# Patient Record
Sex: Female | Born: 1990 | Race: Black or African American | Hispanic: No | Marital: Single | Smoking: Never smoker
Health system: Southern US, Community
[De-identification: ages and names within clinical notes are randomized; demographics above are authoritative.]

## PROBLEM LIST (undated history)

## (undated) DIAGNOSIS — M419 Scoliosis, unspecified: Secondary | ICD-10-CM

## (undated) HISTORY — PX: WISDOM TOOTH EXTRACTION: SHX21

---

## 2015-09-12 ENCOUNTER — Emergency Department (HOSPITAL_COMMUNITY): Payer: 59

## 2015-09-12 ENCOUNTER — Encounter (HOSPITAL_COMMUNITY): Payer: Self-pay | Admitting: Emergency Medicine

## 2015-09-12 ENCOUNTER — Emergency Department (HOSPITAL_COMMUNITY)
Admission: EM | Admit: 2015-09-12 | Discharge: 2015-09-12 | Disposition: A | Discharge status: Home / Self Care | Payer: 59 | Attending: Emergency Medicine | Admitting: Emergency Medicine

## 2015-09-12 DIAGNOSIS — Y9289 Other specified places as the place of occurrence of the external cause: Secondary | ICD-10-CM | POA: Diagnosis not present

## 2015-09-12 DIAGNOSIS — R200 Anesthesia of skin: Secondary | ICD-10-CM | POA: Diagnosis not present

## 2015-09-12 DIAGNOSIS — W07XXXA Fall from chair, initial encounter: Secondary | ICD-10-CM | POA: Diagnosis not present

## 2015-09-12 DIAGNOSIS — S0990XA Unspecified injury of head, initial encounter: Secondary | ICD-10-CM | POA: Insufficient documentation

## 2015-09-12 DIAGNOSIS — M419 Scoliosis, unspecified: Secondary | ICD-10-CM | POA: Diagnosis not present

## 2015-09-12 DIAGNOSIS — M545 Low back pain: Secondary | ICD-10-CM

## 2015-09-12 DIAGNOSIS — S199XXA Unspecified injury of neck, initial encounter: Secondary | ICD-10-CM | POA: Insufficient documentation

## 2015-09-12 DIAGNOSIS — S3992XA Unspecified injury of lower back, initial encounter: Secondary | ICD-10-CM | POA: Diagnosis not present

## 2015-09-12 DIAGNOSIS — Y9389 Activity, other specified: Secondary | ICD-10-CM | POA: Insufficient documentation

## 2015-09-12 DIAGNOSIS — W19XXXA Unspecified fall, initial encounter: Secondary | ICD-10-CM

## 2015-09-12 DIAGNOSIS — Y998 Other external cause status: Secondary | ICD-10-CM | POA: Insufficient documentation

## 2015-09-12 HISTORY — DX: Scoliosis, unspecified: M41.9

## 2015-09-12 MED ORDER — METHOCARBAMOL 500 MG PO TABS: 500.0000 mg | ORAL_TABLET | Freq: Two times a day (BID) | ORAL | Status: AC

## 2015-09-12 MED ORDER — IBUPROFEN 600 MG PO TABS: 600.0000 mg | ORAL_TABLET | Freq: Four times a day (QID) | ORAL | Status: AC | PRN

## 2015-09-12 NOTE — ED Notes (Signed)
Patient able to dress and ambulate independently 

## 2015-09-12 NOTE — ED Provider Notes (Signed)
CSN: [id number]     Arrival date & time 09/12/15  1635 History  By signing my name below, I, Octavia Heirrianna Nassar, attest that this documentation has been prepared under the direction and in the presence of Elson AreasLeslie K Ameir Faria, PA-C. Electronically Signed: Octavia HeirArianna Nassar, ED Scribe. 09/12/2015. 6:37 PM.    Chief Complaint  Patient presents with  . Fall      The history is provided by the patient. No language interpreter was used.   HPI Comments: Brandy Hutchinson is a 25 y.o. female who presents to the Emergency Department complaining of a sudden onset, gradual worsening, moderate, fall onset 3 days ago. She expresses head pain, neck pain, and all over back pain. She says that her hips hurt as well and she has been having tingling in her fingers. Pt states she tripped over some chairs and fell backwards, landing on her back. Pt has been taking ibuprofen, icy hot and applying ice to alleviate her pain with no relief. Denies loss of consciousness and abdominal pain.   Past Medical History  Diagnosis Date  . Scoliosis    Past Surgical History  Procedure Laterality Date  . Wisdom tooth extraction     History reviewed. No pertinent family history. Social History  Substance Use Topics  . Smoking status: Never Smoker   . Smokeless tobacco: None  . Alcohol Use: Yes     Comment: occ   OB History    Gravida Para Term Preterm AB TAB SAB Ectopic Multiple Living   0 0 0 0 0 0 0 0 0 0      Review of Systems  Gastrointestinal: Negative for abdominal pain.  Musculoskeletal: Positive for back pain and neck pain.  Neurological: Positive for numbness and headaches.  All other systems reviewed and are negative.     Allergies  Review of patient's allergies indicates no known allergies.  Home Medications   Prior to Admission medications   Not on File   Triage vitals: BP 124/76 mmHg  Pulse 75  Temp(Src) 98.7 F (37.1 C) (Oral)  Resp 20  Ht 5' (1.524 m)  Wt 162 lb (73.483 kg)  BMI 31.64 kg/m2  SpO2  98%  LMP 08/08/2015 Physical Exam  Constitutional: She is oriented to person, place, and time. She appears well-developed and well-nourished.  HENT:  Head: Normocephalic.  Eyes: EOM are normal.  Neck: Normal range of motion.  Pulmonary/Chest: Effort normal.  Abdominal: She exhibits no distension.  Musculoskeletal: Normal range of motion.  Tender in full thoracic spine  Neurological: She is alert and oriented to person, place, and time.  Psychiatric: She has a normal mood and affect.  Nursing note and vitals reviewed.   ED Course  Procedures  DIAGNOSTIC STUDIES: Oxygen Saturation is 98% on RA, normal by my interpretation.  COORDINATION OF CARE:  6:36 PM Will order DG cervical spine and DG thoracic spine.  Discussed treatment plan with pt at bedside and pt agreed to plan.  Labs Review Labs Reviewed - No data to display  Imaging Review Dg Cervical Spine Complete  09/12/2015  CLINICAL DATA:  Pain following fall EXAM: CERVICAL SPINE - COMPLETE 4+ VIEW COMPARISON:  None. FINDINGS: Frontal, lateral, open-mouth odontoid, and bilateral oblique views were obtained. There is no fracture or spondylolisthesis. Prevertebral soft tissues and predental space regions are normal. The disc spaces appear normal. There is no appreciable facet arthropathy on the oblique views. There is reversal of lordotic curvature. IMPRESSION: No fracture or spondylolisthesis. No appreciable arthropathy. Reversal of  lordotic curvature likely indicates muscle spasm. If there is any concern clinically for ligamentous injury, lateral flexion-extension views could be helpful to further assess. Electronically Signed   By: Bretta Bang III M.D.   On: 09/12/2015 19:40   Dg Thoracic Spine 2 View  09/12/2015  CLINICAL DATA:  Pain following fall EXAM: THORACIC SPINE 3 VIEWS COMPARISON:  None. FINDINGS: Frontal, lateral, and swimmer's views were obtained. There is thoracolumbar dextroscoliosis. There is no fracture or  spondylolisthesis. The disc spaces appear normal. No erosive change or paraspinous lesion. IMPRESSION: Thoracolumbar dextroscoliosis. No fracture or spondylolisthesis. No appreciable arthropathy. Electronically Signed   By: Bretta Bang III M.D.   On: 09/12/2015 19:41   I have personally reviewed and evaluated these images and lab results as part of my medical decision-making.   EKG Interpretation None      MDM xrays no acute abnormality.  Rx for ibuprofen and Robaxin.   Pt advised to follow up with her MD for recheck if symptoms persist.    Final diagnoses:  Fall, initial encounter  Low back pain, unspecified back pain laterality, with sciatica presence unspecified   Robaxin  Ibuprofen An After Visit Summary was printed and given to the patient.    Elson Areas, PA-C 09/12/15 2018  Lonia Skinner Camargo, PA-C 09/12/15 2018  Lyndal Pulley, MD 09/13/15 0200

## 2015-09-12 NOTE — ED Notes (Signed)
Pt states she tripped over a chair and fell hitting the back of her head. Pt c/o pain from her head to her knees.

## 2015-09-12 NOTE — Discharge Instructions (Signed)
Back Pain, Adult °Back pain is very common in adults. The cause of back pain is rarely dangerous and the pain often gets better over time. The cause of your back pain may not be known. Some common causes of back pain include: °· Strain of the muscles or ligaments supporting the spine. °· Wear and tear (degeneration) of the spinal disks. °· Arthritis. °· Direct injury to the back. °For many people, back pain may return. Since back pain is rarely dangerous, most people can learn to manage this condition on their own. °HOME CARE INSTRUCTIONS °Watch your back pain for any changes. The following actions may help to lessen any discomfort you are feeling: °· Remain active. It is stressful on your back to sit or stand in one place for long periods of time. Do not sit, drive, or stand in one place for more than 30 minutes at a time. Take short walks on even surfaces as soon as you are able. Try to increase the length of time you walk each day. °· Exercise regularly as directed by your health care provider. Exercise helps your back heal faster. It also helps avoid future injury by keeping your muscles strong and flexible. °· Do not stay in bed. Resting more than 1-2 days can delay your recovery. °· Pay attention to your body when you bend and lift. The most comfortable positions are those that put less stress on your recovering back. Always use proper lifting techniques, including: °· Bending your knees. °· Keeping the load close to your body. °· Avoiding twisting. °· Find a comfortable position to sleep. Use a firm mattress and lie on your side with your knees slightly bent. If you lie on your back, put a pillow under your knees. °· Avoid feeling anxious or stressed. Stress increases muscle tension and can worsen back pain. It is important to recognize when you are anxious or stressed and learn ways to manage it, such as with exercise. °· Take medicines only as directed by your health care provider. Over-the-counter  medicines to reduce pain and inflammation are often the most helpful. Your health care provider may prescribe muscle relaxant drugs. These medicines help dull your pain so you can more quickly return to your normal activities and healthy exercise. °· Apply ice to the injured area: °· Put ice in a plastic bag. °· Place a towel between your skin and the bag. °· Leave the ice on for 20 minutes, 2-3 times a day for the first 2-3 days. After that, ice and heat may be alternated to reduce pain and spasms. °· Maintain a healthy weight. Excess weight puts extra stress on your back and makes it difficult to maintain good posture. °SEEK MEDICAL CARE IF: °· You have pain that is not relieved with rest or medicine. °· You have increasing pain going down into the legs or buttocks. °· You have pain that does not improve in one week. °· You have night pain. °· You lose weight. °· You have a fever or chills. °SEEK IMMEDIATE MEDICAL CARE IF:  °· You develop new bowel or bladder control problems. °· You have unusual weakness or numbness in your arms or legs. °· You develop nausea or vomiting. °· You develop abdominal pain. °· You feel faint. °  °This information is not intended to replace advice given to you by your health care provider. Make sure you discuss any questions you have with your health care provider. °  °Document Released: 05/21/2005 Document Revised: 06/11/2014 Document Reviewed: 09/22/2013 °Elsevier Interactive Patient Education ©2016 Elsevier   Inc[employer] Fall Prevention in the Home  Falls can cause injuries and can affect people from all age groups. There are many simple things that you can do to make your home safe and to help prevent falls. WHAT CAN I DO ON THE OUTSIDE OF MY HOME?  Regularly repair the edges of walkways and driveways and fix any cracks.  Remove high doorway thresholds.  Trim any shrubbery on the main path into your home.  Use bright outdoor lighting.  Clear walkways of debris and clutter,  including tools and rocks.  Regularly check that handrails are securely fastened and in good repair. Both sides of any steps should have handrails.  Install guardrails along the edges of any raised decks or porches.  Have leaves, snow, and ice cleared regularly.  Use sand or salt on walkways during winter months.  In the garage, clean up any spills right away, including grease or oil spills. WHAT CAN I DO IN THE BATHROOM?  Use night lights.  Install grab bars by the toilet and in the tub and shower. Do not use towel bars as grab bars.  Use non-skid mats or decals on the floor of the tub or shower.  If you need to sit down while you are in the shower, use a plastic, non-slip stool.Marland Kitchen  Keep the floor dry. Immediately clean up any water that spills on the floor.  Remove soap buildup in the tub or shower on a regular basis.  Attach bath mats securely with double-sided non-slip rug tape.  Remove throw rugs and other tripping hazards from the floor. WHAT CAN I DO IN THE BEDROOM?  Use night lights.  Make sure that a bedside light is easy to reach.  Do not use oversized bedding that drapes onto the floor.  Have a firm chair that has side arms to use for getting dressed.  Remove throw rugs and other tripping hazards from the floor. WHAT CAN I DO IN THE KITCHEN?   Clean up any spills right away.  Avoid walking on wet floors.  Place frequently used items in easy-to-reach places.  If you need to reach for something above you, use a sturdy step stool that has a grab bar.  Keep electrical cables out of the way.  Do not use floor polish or wax that makes floors slippery. If you have to use wax, make sure that it is non-skid floor wax.  Remove throw rugs and other tripping hazards from the floor. WHAT CAN I DO IN THE STAIRWAYS?  Do not leave any items on the stairs.  Make sure that there are handrails on both sides of the stairs. Fix handrails that are broken or loose. Make  sure that handrails are as long as the stairways.  Check any carpeting to make sure that it is firmly attached to the stairs. Fix any carpet that is loose or worn.  Avoid having throw rugs at the top or bottom of stairways, or secure the rugs with carpet tape to prevent them from moving.  Make sure that you have a light switch at the top of the stairs and the bottom of the stairs. If you do not have them, have them installed. WHAT ARE SOME OTHER FALL PREVENTION TIPS?  Wear closed-toe shoes that fit well and support your feet. Wear shoes that have rubber soles or low heels.  When you use a stepladder, make sure that it is completely opened and that the sides are firmly locked. Have someone hold the ladder  while you are using it. Do not climb a closed stepladder.  Add color or contrast paint or tape to grab bars and handrails in your home. Place contrasting color strips on the first and last steps.  Use mobility aids as needed, such as canes, walkers, scooters, and crutches.  Turn on lights if it is dark. Replace any light bulbs that burn out.  Set up furniture so that there are clear paths. Keep the furniture in the same spot.  Fix any uneven floor surfaces.  Choose a carpet design that does not hide the edge of steps of a stairway.  Be aware of any and all pets.  Review your medicines with your healthcare provider. Some medicines can cause dizziness or changes in blood pressure, which increase your risk of falling. Talk with your health care provider about other ways that you can decrease your risk of falls. This may include working with a physical therapist or trainer to improve your strength, balance, and endurance.   This information is not intended to replace advice given to you by your health care provider. Make sure you discuss any questions you have with your health care provider.   Document Released: 05/11/2002 Document Revised: 10/05/2014 Document Reviewed:  06/25/2014 Elsevier Interactive Patient Education [employer]2016 Elsevier Inc.

## 2015-09-12 NOTE — ED Notes (Signed)
Registration at bedside.

## 2015-09-28 ENCOUNTER — Emergency Department
Admission: EM | Admit: 2015-09-28 | Discharge: 2015-09-28 | Disposition: A | Discharge status: Home / Self Care | Payer: 59 | Attending: Emergency Medicine | Admitting: Emergency Medicine

## 2015-09-28 ENCOUNTER — Emergency Department: Payer: 59

## 2015-09-28 ENCOUNTER — Encounter: Payer: Self-pay | Admitting: *Deleted

## 2015-09-28 DIAGNOSIS — R0989 Other specified symptoms and signs involving the circulatory and respiratory systems: Secondary | ICD-10-CM | POA: Diagnosis present

## 2015-09-28 NOTE — ED Provider Notes (Signed)
[facility] Emergency Department Provider Note  Time seen: 4:10 PM  I have reviewed the triage vital signs and the nursing notes.   HISTORY  Chief Complaint Swallowed Foreign Body    HPI Brandy Hutchinson is a 25 y.o. female with no past medical history presents the emergency department with a foreign body sensation to her throat. According to the patient she was eating fish last night. Denies any pain while eating however states since finishing eating she has had a feeling of irritation in her left side of her throat. She thinks she might have a fishbone stuck in her throat. Denies any pain swallowing. Denies any trouble breathing. Denies any "pain." She does state it feels somewhat irritated in this region.     Past Medical History  Diagnosis Date  . Scoliosis     There are no active problems to display for this patient.   Past Surgical History  Procedure Laterality Date  . Wisdom tooth extraction      Current Outpatient Rx  Name  Route  Sig  Dispense  Refill  . ibuprofen (ADVIL,MOTRIN) 600 MG tablet   Oral   Take 1 tablet (600 mg total) by mouth every 6 (six) hours as needed.   30 tablet   0   . methocarbamol (ROBAXIN) 500 MG tablet   Oral   Take 1 tablet (500 mg total) by mouth 2 (two) times daily.   20 tablet   0     Allergies Review of patient's allergies indicates no known allergies.  No family history on file.  Social History Social History  Substance Use Topics  . Smoking status: Never Smoker   . Smokeless tobacco: None  . Alcohol Use: Yes     Comment: occ    Review of Systems Constitutional: Negative for fever. ENT: Throat irritation Cardiovascular: Negative for chest pain. Respiratory: Negative for shortness of breath. Gastrointestinal: Negative for abdominal pain Neurological: Negative for headache 10-point ROS otherwise negative.  ____________________________________________   PHYSICAL EXAM:  VITAL SIGNS: ED  Triage Vitals  Enc Vitals Group     BP 09/28/15 1509 127/75 mmHg     Pulse Rate 09/28/15 1507 83     Resp 09/28/15 1507 20     Temp 09/28/15 1507 98 F (36.7 C)     Temp Source 09/28/15 1507 Oral     SpO2 09/28/15 1507 98 %     Weight 09/28/15 1507 163 lb (73.936 kg)     Height 09/28/15 1507 5\' 3"  (1.6 m)     Head Cir --      Peak Flow --      Pain Score 09/28/15 1536 0     Pain Loc --      Pain Edu? --      Excl. in GC? --     Constitutional: Alert and oriented. Well appearing and in no distress. Eyes: Normal exam ENT   Head: Normocephalic and atraumatic.   Mouth/Throat: Mucous membranes are moist.No abnormalities noted. Cardiovascular: Normal rate, regular rhythm. No murmur Respiratory: Normal respiratory effort without tachypnea nor retractions. Breath sounds are clear Gastrointestinal: Soft and nontender. No distention.  Musculoskeletal: Nontender with normal range of motion in all extremities. Neurologic:  Normal speech and language. No gross focal neurologic deficits Skin:  Skin is warm, dry and intact.  Psychiatric: Mood and affect are normal.   ____________________________________________     RADIOLOGY  Soft tissue neck x-ray negative.  ____________________________________________    INITIAL IMPRESSION /  ASSESSMENT AND PLAN / ED COURSE  Pertinent labs & imaging results that were available during my care of the patient were reviewed by me and considered in my medical decision making (see chart for details).  Patient presents the emergency department with a foreign body sensation to her mid throat. Patient states she ate fish last night is concerned she might have a fishbone stuck in her throat. Denies any pain when swallowing. Denies any trouble breathing. Normal physical exam. We'll obtain an x-ray, soft tissue of the neck to further evaluate. Patient agreeable plan.  X-rays negative. Patient is in no distress. We'll discharge home with ENT follow-up  in 2-3 days if patient continues to have symptoms.  ____________________________________________   FINAL CLINICAL IMPRESSION(S) / ED DIAGNOSES  Foreign body sensation   Minna Antis, MD 09/28/15 1714

## 2015-09-28 NOTE — Discharge Instructions (Signed)
Please call the number provided for ENT to arrange a follow-up appointment in 2-3 days if you continue to have symptoms. Return to the emergency department for any worsening pain, trouble swallowing, or any trouble breathing.

## 2015-09-28 NOTE — ED Notes (Signed)
IV not started per pt request - she request to speak to MD prior to IV being started

## 2015-09-28 NOTE — ED Notes (Signed)
Pt reports eating fish yesterday and a piece of bone got stuck in her throat - she is not having any difficulty breathing but feels like her throat is slightly irritated and/or swollen - at this time in no acute distress

## 2015-09-28 NOTE — ED Notes (Signed)
Pt was eating fish last night, pt swallowed a " fish bone", pt is in no resp distress, pt reports throat feels scratchy

## 2016-12-17 ENCOUNTER — Ambulatory Visit (HOSPITAL_COMMUNITY)
Admission: EM | Admit: 2016-12-17 | Discharge: 2016-12-17 | Disposition: A | Discharge status: Home / Self Care | Payer: Managed Care, Other (non HMO) | Attending: Internal Medicine | Admitting: Internal Medicine

## 2016-12-17 ENCOUNTER — Encounter (HOSPITAL_COMMUNITY): Payer: Self-pay | Admitting: *Deleted

## 2016-12-17 DIAGNOSIS — R059 Cough, unspecified: Secondary | ICD-10-CM

## 2016-12-17 DIAGNOSIS — R05 Cough: Secondary | ICD-10-CM

## 2016-12-17 DIAGNOSIS — J069 Acute upper respiratory infection, unspecified: Secondary | ICD-10-CM

## 2016-12-17 NOTE — ED Triage Notes (Signed)
5   Days    Ago  Started  Off  With   sorethroat     Cough    Body  Aches   Chest  Pain   Chills   And   Sweats   And  A  headche   From   Coughing

## 2016-12-17 NOTE — Discharge Instructions (Signed)
Sometimes it is difficult to discern the difference between upper respiratory infection and allergies. You may have one or the other but the viral component seems a little greater than your allergy symptoms. Either way the treatment is nearly identical. The following medications should help with your symptoms. If this is truly just a virus or "summer cold" he should go away within a few more days. Sudafed PE 10 mg every 4 to 6 hours as needed for congestion Allegra or Zyrtec daily as needed for drainage and runny nose. For stronger antihistamine may take Chlor-Trimeton 2 to 4 mg every 4 to 6 hours, may cause drowsiness. Saline nasal spray used frequently. Drink plenty of fluids and stay well-hydrated. Flonase or Rhinocort nasal spray daily Robitussin DM for cough, Get the cheap kind.

## 2016-12-17 NOTE — ED Provider Notes (Signed)
CSN: [id number]     Arrival date & time 12/17/16  1609 History   First MD Initiated Contact with Patient 12/17/16 1746     Chief Complaint  Patient presents with  . Cough   (Consider location/radiation/quality/duration/timing/severity/associated sxs/prior Treatment) 26 year old female complaining of sore throat, dry cough, body aches, chills and sweats and right upper anterior chest pain that is worse with coughing. Also has a headache that is worse with coughing. She is taking no medications for her symptoms. She perceives no PND.      Past Medical History:  Diagnosis Date  . Scoliosis    Past Surgical History:  Procedure Laterality Date  . WISDOM TOOTH EXTRACTION     History reviewed. No pertinent family history. Social History  Substance Use Topics  . Smoking status: Never Smoker  . Smokeless tobacco: Not on file  . Alcohol use Yes     Comment: occ   OB History    Gravida Para Term Preterm AB Living   0 0 0 0 0 0   SAB TAB Ectopic Multiple Live Births   0 0 0 0       Review of Systems  Constitutional: Positive for activity change, chills and fever.  HENT: Positive for sore throat. Negative for postnasal drip and rhinorrhea.   Respiratory: Positive for cough. Negative for shortness of breath.   Musculoskeletal: Positive for myalgias.  Neurological: Positive for headaches.  All other systems reviewed and are negative.   Allergies  Patient has no known allergies.  Home Medications   Prior to Admission medications   Medication Sig Start Date End Date Taking? Authorizing Provider  ibuprofen (ADVIL,MOTRIN) 600 MG tablet Take 1 tablet (600 mg total) by mouth every 6 (six) hours as needed. 09/12/15   Elson AreasSofia, Leslie K, PA-C  methocarbamol (ROBAXIN) 500 MG tablet Take 1 tablet (500 mg total) by mouth 2 (two) times daily. 09/12/15   Elson AreasSofia, Leslie K, PA-C   Meds Ordered and Administered this Visit  Medications - No data to display  BP 111/67 (BP Location: Right Arm)    Pulse 73   Temp 99.2 F (37.3 C) (Oral)   Resp 18   LMP 11/24/2016   SpO2 100%  No data found.   Physical Exam  Constitutional: She is oriented to person, place, and time. She appears well-developed and well-nourished. No distress.  HENT:  Head: Normocephalic and atraumatic.  Mouth/Throat: No oropharyngeal exudate.  Oral pharynx mildly injected with scant clear PND and light cobblestoning. No exudates or swelling.  Eyes: Pupils are equal, round, and reactive to light. EOM are normal.  Mild bilateral lower conjunctival erythema  Neck: Normal range of motion. Neck supple. No tracheal deviation present.  Cardiovascular: Normal rate, regular rhythm, normal heart sounds and intact distal pulses.   Pulmonary/Chest: Effort normal and breath sounds normal. No respiratory distress. She has no wheezes. She exhibits tenderness.  Right upper anterior chest wall tenderness.  Abdominal: Soft. There is no tenderness. There is no rebound.  Musculoskeletal: Normal range of motion. She exhibits no edema or tenderness.  Lymphadenopathy:    She has no cervical adenopathy.  Neurological: She is alert and oriented to person, place, and time. No cranial nerve deficit.  Skin: Skin is warm and dry. No rash noted.  Psychiatric: She has a normal mood and affect.  Nursing note and vitals reviewed.   Urgent Care Course     Procedures (including critical care time)  Labs Review Labs Reviewed - No data to  display  Imaging Review No results found.   Visual Acuity Review  Right Eye Distance:   Left Eye Distance:   Bilateral Distance:    Right Eye Near:   Left Eye Near:    Bilateral Near:         MDM   1. Cough   2. Viral upper respiratory tract infection    Sometimes it is difficult to discern the difference between upper respiratory infection and allergies. You may have one or the other but the viral component seems a little greater than your allergy symptoms. Either way the treatment  is nearly identical. The following medications should help with your symptoms. If this is truly just a virus or "summer cold" he should go away within a few more days. Sudafed PE 10 mg every 4 to 6 hours as needed for congestion Allegra or Zyrtec daily as needed for drainage and runny nose. For stronger antihistamine may take Chlor-Trimeton 2 to 4 mg every 4 to 6 hours, may cause drowsiness. Saline nasal spray used frequently. Drink plenty of fluids and stay well-hydrated. Flonase or Rhinocort nasal spray daily Robitussin DM for cough, Get the cheap kind.    Hayden Rasmussen, NP 12/17/16 [phone]

## 2019-03-16 ENCOUNTER — Other Ambulatory Visit: Payer: Self-pay

## 2019-03-16 ENCOUNTER — Emergency Department
Admission: EM | Admit: 2019-03-16 | Discharge: 2019-03-17 | Disposition: A | Discharge status: Home / Self Care | Payer: BC Managed Care – PPO | Attending: Emergency Medicine | Admitting: Emergency Medicine

## 2019-03-16 DIAGNOSIS — R1013 Epigastric pain: Secondary | ICD-10-CM | POA: Diagnosis not present

## 2019-03-16 DIAGNOSIS — Z79899 Other long term (current) drug therapy: Secondary | ICD-10-CM | POA: Insufficient documentation

## 2019-03-16 LAB — COMPREHENSIVE METABOLIC PANEL
ALT: 12 U/L (ref 0–44)
AST: 16 U/L (ref 15–41)
Albumin: 4.3 g/dL (ref 3.5–5.0)
Alkaline Phosphatase: 59 U/L (ref 38–126)
Anion gap: 7 (ref 5–15)
BUN: 16 mg/dL (ref 6–20)
CO2: 26 mmol/L (ref 22–32)
Calcium: 9.5 mg/dL (ref 8.9–10.3)
Chloride: 105 mmol/L (ref 98–111)
Creatinine, Ser: 0.95 mg/dL (ref 0.44–1.00)
GFR calc Af Amer: >60 mL/min (ref >60)
GFR calc non Af Amer: >60 mL/min (ref >60)
Glucose, Bld: 103 mg/dL — ABNORMAL HIGH (ref 70–99)
Potassium: 3.8 mmol/L (ref 3.5–5.1)
Sodium: 138 mmol/L (ref 135–145)
Total Bilirubin: 0.5 mg/dL (ref 0.3–1.2)
Total Protein: 8.1 g/dL (ref 6.5–8.1)

## 2019-03-16 LAB — CBC
HCT: 42.5 % (ref 36.0–46.0)
Hemoglobin: 13.8 g/dL (ref 12.0–15.0)
MCH: 29.2 pg (ref 26.0–34.0)
MCHC: 32.5 g/dL (ref 30.0–36.0)
MCV: 89.9 fL (ref 80.0–100.0)
Platelets: 216 10*3/uL (ref 150–400)
RBC: 4.73 MIL/uL (ref 3.87–5.11)
RDW: 12.4 % (ref 11.5–15.5)
WBC: 7.7 10*3/uL (ref 4.0–10.5)
nRBC: 0 % (ref 0.0–0.2)

## 2019-03-16 LAB — URINALYSIS, COMPLETE (UACMP) WITH MICROSCOPIC
Bilirubin Urine: NEGATIVE
Glucose, UA: NEGATIVE mg/dL
Hgb urine dipstick: NEGATIVE
Ketones, ur: NEGATIVE mg/dL
Leukocytes,Ua: NEGATIVE
Nitrite: NEGATIVE
Protein, ur: NEGATIVE mg/dL
Specific Gravity, Urine: 1.026 (ref 1.005–1.030)
pH: 5 (ref 5.0–8.0)

## 2019-03-16 LAB — LIPASE, BLOOD: Lipase: 25 U/L (ref 11–51)

## 2019-03-16 LAB — POCT PREGNANCY, URINE: Preg Test, Ur: NEGATIVE

## 2019-03-16 MED ORDER — LIDOCAINE VISCOUS HCL 2 % MT SOLN
15.0000 mL | Freq: Once | OROMUCOSAL | Status: AC
  Administered 2019-03-17: 15 mL via ORAL
  Filled 2019-03-16: qty 15

## 2019-03-16 MED ORDER — ALUM & MAG HYDROXIDE-SIMETH 200-200-20 MG/5ML PO SUSP
30.0000 mL | Freq: Once | ORAL | Status: AC
  Administered 2019-03-17: 30 mL via ORAL
  Filled 2019-03-16: qty 30

## 2019-03-16 MED ORDER — OMEPRAZOLE 2 MG/ML ORAL SUSPENSION: 20.0000 mg | Freq: Once | ORAL | Status: DC

## 2019-03-16 NOTE — ED Provider Notes (Signed)
[facility] Emergency Department Provider Note  ____________________________________________   First MD Initiated Contact with Patient 03/16/19 2341     (approximate)  I have reviewed the triage vital signs and the nursing notes.   HISTORY  Chief Complaint Abdominal Pain    HPI Brandy Hutchinson is a 28 y.o. female with irritable bowel syndrome who presents with epigastric abdominal pain.  Patient had intermittent epigastric abdominal pain for the past 3 to 4 weeks.  The pain is intermittent, moderate, sensation goes up into her chest, not better with Tums, does not seem to be related to food and occurs randomly.  Usually last a few hours then goes away.  No shortness of breath, leg swelling, history of blood clots.  Currently does not have any chest pain just some epigastric tenderness.  Denies alcohol use, frequent NSAID or aspirin use.   Past Medical History:  Diagnosis Date   Scoliosis     There are no active problems to display for this patient.   Past Surgical History:  Procedure Laterality Date   WISDOM TOOTH EXTRACTION      Prior to Admission medications   Medication Sig Start Date End Date Taking? Authorizing Provider  ibuprofen (ADVIL,MOTRIN) 600 MG tablet Take 1 tablet (600 mg total) by mouth every 6 (six) hours as needed. 09/12/15   Fransico Meadow, PA-C  methocarbamol (ROBAXIN) 500 MG tablet Take 1 tablet (500 mg total) by mouth 2 (two) times daily. 09/12/15   Fransico Meadow, PA-C    Allergies Patient has no known allergies.  No family history on file.  Social History Social History   Tobacco Use   Smoking status: Never Smoker  Substance Use Topics   Alcohol use: Yes    Comment: occ   Drug use: No      Review of Systems Constitutional: No fever/chills Eyes: No visual changes. ENT: No sore throat. Cardiovascular: Denies chest pain. Respiratory: Denies shortness of breath. Gastrointestinal: Positive epigastric  tenderness no nausea, no vomiting.  No diarrhea.  No constipation. Genitourinary: Negative for dysuria. Musculoskeletal: Negative for back pain. Skin: Negative for rash. Neurological: Negative for headaches, focal weakness or numbness. All other ROS negative ____________________________________________   PHYSICAL EXAM:  VITAL SIGNS: ED Triage Vitals [03/16/19 2208]  Enc Vitals Group     BP 120/88     Pulse Rate 81     Resp 20     Temp 98.4 F (36.9 C)     Temp Source Oral     SpO2 100 %     Weight 193 lb (87.5 kg)     Height 5\' 3"  (1.6 m)     Head Circumference      Peak Flow      Pain Score 10     Pain Loc      Pain Edu?      Excl. in [place]?     Constitutional: Alert and oriented. Well appearing and in no acute distress. Eyes: Conjunctivae are normal. EOMI. Head: Atraumatic. Nose: No congestion/rhinnorhea. Mouth/Throat: Mucous membranes are moist.   Neck: No stridor. Trachea Midline. FROM Cardiovascular: Normal rate, regular rhythm. Grossly normal heart sounds.  Good peripheral circulation. Respiratory: Normal respiratory effort.  No retractions. Lungs CTAB. Gastrointestinal: Soft with mild epigastric tenderness.  No rebound.  No guarding.  No distention. No abdominal bruits.  Musculoskeletal: No lower extremity tenderness nor edema.  No joint effusions. Neurologic:  Normal speech and language. No gross focal neurologic deficits are appreciated.  Skin:  Skin is warm, dry and intact. No rash noted. Psychiatric: Mood and affect are normal. Speech and behavior are normal. GU: Deferred   ____________________________________________   LABS (all labs ordered are listed, but only abnormal results are displayed)  Labs Reviewed  COMPREHENSIVE METABOLIC PANEL - Abnormal; Notable for the following components:      Result Value   Glucose, Bld 103 (*)    All other components within normal limits  URINALYSIS, COMPLETE (UACMP) WITH MICROSCOPIC - Abnormal; Notable for the  following components:   Color, Urine YELLOW (*)    APPearance HAZY (*)    Bacteria, UA RARE (*)    All other components within normal limits  CBC  LIPASE, BLOOD  POC URINE PREG, ED  POCT PREGNANCY, URINE   ____________________________________________  RADIOLOGY   Official radiology report(s): US Abdomen Limited Ruq  Result Date: 03/17/2019 CLINICAL DATA:  Epigastric pain EXAM: ULTRASOUND ABDOMEN LIMITED RIGHT UPPER QUADRANT COMPARISON:  None. FINDINGS: Gallbladder: No gallstones or wall thickening visualized. No sonographic Murphy sign noted by sonographer. Common bile duct: Diameter: 4 mm, within normal limits Liver: No focal lesion identified. Within normal limits in parenchymal echogenicity. Portal vein is patent on color Doppler imaging with normal direction of blood flow towards the liver. Other: None. IMPRESSION: Unremarkable right upper quadrant ultrasound. Electronically Signed   By: Kreg Shropshire M.D.   On: 03/17/2019 02:36    ____________________________________________   PROCEDURES  Procedure(s) performed (including Critical Care):  Procedures   ____________________________________________   INITIAL IMPRESSION / ASSESSMENT AND PLAN / ED COURSE  Brandy Hutchinson was evaluated in Emergency Department on 03/16/2019 for the symptoms described in the history of present illness. She was evaluated in the context of the global COVID-19 pandemic, which necessitated consideration that the patient might be at risk for infection with the SARS-CoV-2 virus that causes COVID-19. Institutional protocols and algorithms that pertain to the evaluation of patients at risk for COVID-19 are in a state of rapid change based on information released by regulatory bodies including the CDC and federal and state organizations. These policies and algorithms were followed during the patient's care in the ED.    She is a well-appearing 28 year old who presents with epigastric abdominal pain that has  been going on for 3 to 4 weeks.  Patient has normal vital signs and does have slight tenderness in her epigastric region but no rebound or guarding or other signs of peritonitis.  Will get labs to evaluate for cholecystitis, pancreatitis, AKI, electrolyte abnormality, pregnancy.  No lower abdominal pain to suggest ovarian issue or appendicitis.  Will get ultrasound to evaluate for gallstones.   Pregnancy test is negative.  White count is normal LFTs are normal.  Kidney function is normal lipase is normal.  UA without signs of UTI but does have 6-10 RBCs.  No flank tenderness to suggest kidney stone.  Reevaluated patient.  Continues to have a little bit of discomfort still.  Ultrasound was negative.  Abdominal exam remains benign.  We discussed CT imaging given normal vital signs and no lower abdominal pain she elected to want to hold off on CT imaging.  Will trial a PPI and Tylenol.  Patient already has a GI doctor that she can follow-up with outpatient for.  She understands return precautions such as vomiting, fevers or any other concerns.   ____________________________________________   FINAL CLINICAL IMPRESSION(S) / ED DIAGNOSES   Final diagnoses:  Epigastric abdominal pain      MEDICATIONS GIVEN DURING THIS  VISIT:  Medications  alum & mag hydroxide-simeth (MAALOX/MYLANTA) 200-200-20 MG/5ML suspension 30 mL (30 mLs Oral Given 03/17/19 0011)    And  lidocaine (XYLOCAINE) 2 % viscous mouth solution 15 mL (15 mLs Oral Given 03/17/19 0011)  pantoprazole sodium (PROTONIX) 40 mg/20 mL oral suspension 40 mg (40 mg Oral Given 03/17/19 0052)     ED Discharge Orders         Ordered    pantoprazole sodium (PROTONIX) 40 mg/20 mL PACK  Daily     03/17/19 0252           Note:  This document was prepared using Dragon voice recognition software and may include unintentional dictation errors.   Concha SeFunke, Jadiel Schmieder E, MD 03/17/19 [phone]

## 2019-03-16 NOTE — ED Triage Notes (Signed)
Pt in with co epigastric pain that started 1 hr pta, took tums and ibuprofen without relief. Pt has hx of IBS but states is different.

## 2019-03-17 ENCOUNTER — Emergency Department: Payer: BC Managed Care – PPO

## 2019-03-17 MED ORDER — PANTOPRAZOLE SODIUM 40 MG PO PACK
40.0000 mg | PACK | Freq: Once | ORAL | Status: AC
  Administered 2019-03-17: 40 mg via ORAL
  Filled 2019-03-17: qty 20

## 2019-03-17 MED ORDER — PANTOPRAZOLE SODIUM 40 MG PO PACK: 20.0000 mg | PACK | Freq: Every day | ORAL | 0 refills | Status: AC

## 2019-03-17 NOTE — Discharge Instructions (Addendum)
Your ultrasound was negative for gallstones.  Your liver enzymes and pancreas enzymes were normal.  We discussed CT imaging at this time we are holding off.  We recommend that you follow-up with GI and trial a course of PPI.  Return to the ER for worsening pain, fevers, vomiting or any other concerns.  You can take 1 g of Tylenol every 8 hours as well.

## 2020-07-06 ENCOUNTER — Other Ambulatory Visit (HOSPITAL_COMMUNITY): Payer: Self-pay | Admitting: Family Medicine

## 2020-07-06 ENCOUNTER — Other Ambulatory Visit: Payer: Self-pay | Admitting: Family Medicine

## 2020-07-06 DIAGNOSIS — N920 Excessive and frequent menstruation with regular cycle: Secondary | ICD-10-CM

## 2020-07-06 DIAGNOSIS — R102 Pelvic and perineal pain: Secondary | ICD-10-CM

## 2020-07-07 ENCOUNTER — Ambulatory Visit: Payer: BC Managed Care – PPO

## 2020-07-07 ENCOUNTER — Other Ambulatory Visit: Payer: Self-pay

## 2021-08-21 IMAGING — US US ABDOMEN LIMITED
1 series · 14 of 25 positions shown · non-contrast
Comparison: None.

CLINICAL DATA: Epigastric pain

EXAM:
ULTRASOUND ABDOMEN LIMITED RIGHT UPPER QUADRANT

[Series 1: us abdomen limited · 14 of 38 slices shown]
[im 1/38]
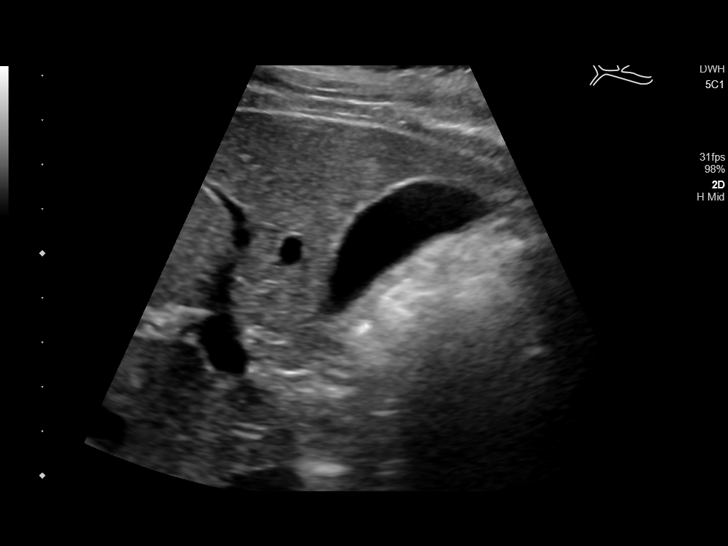
[im 4/38]
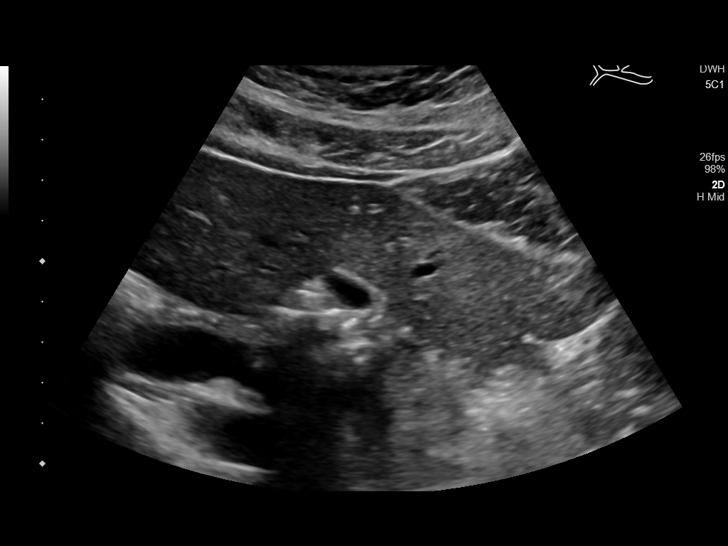
[im 7/38]
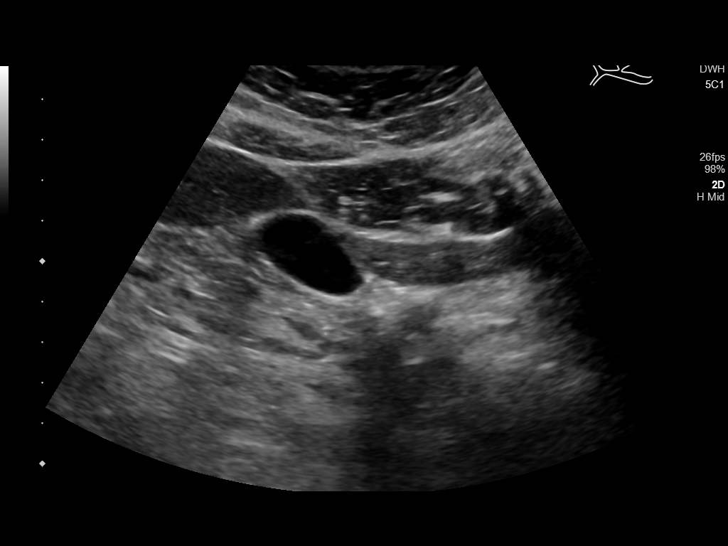
[im 10/38]
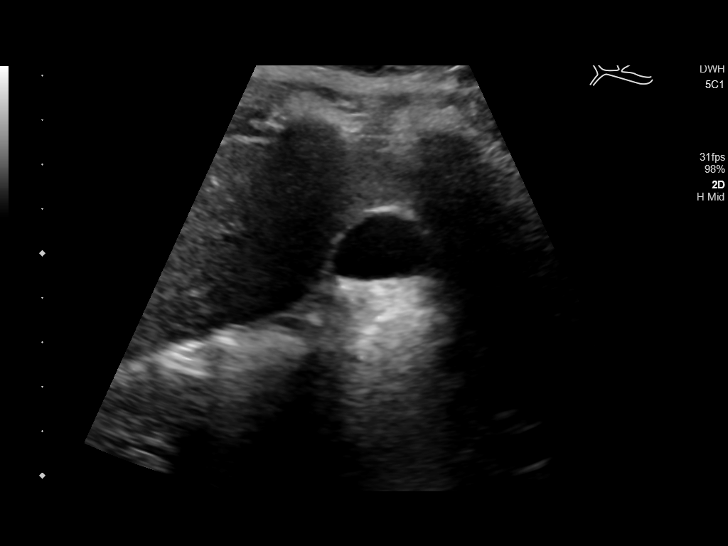
[im 13/38]
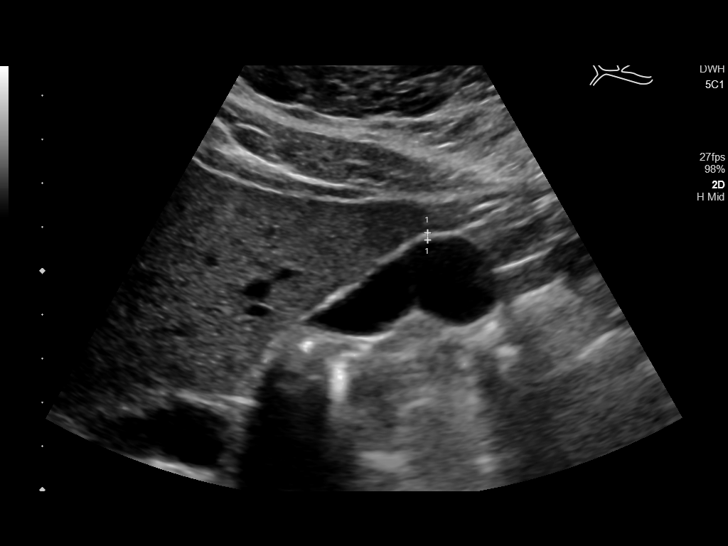
[im 14/38]
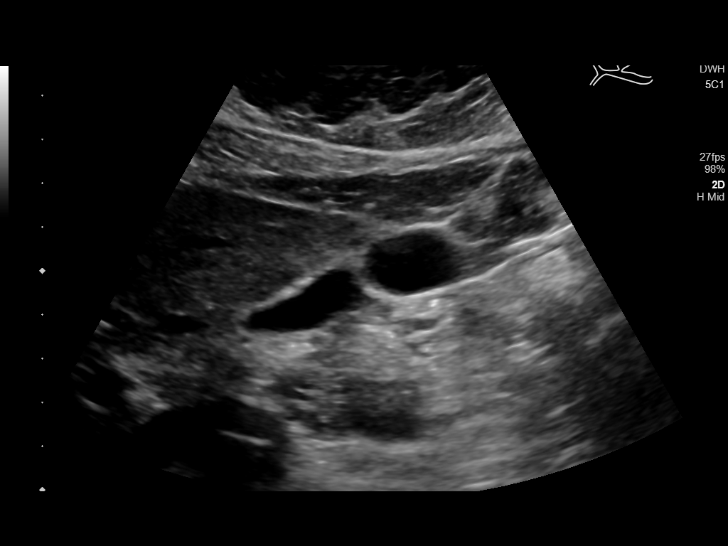
[im 17/38]
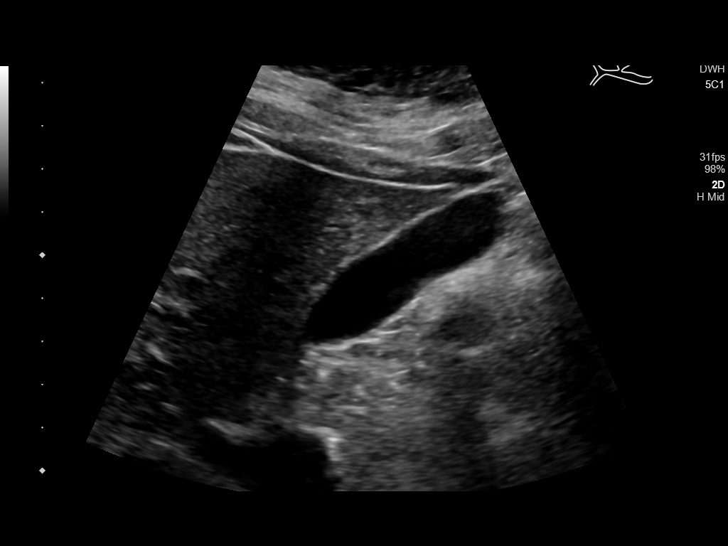
[im 21/38]
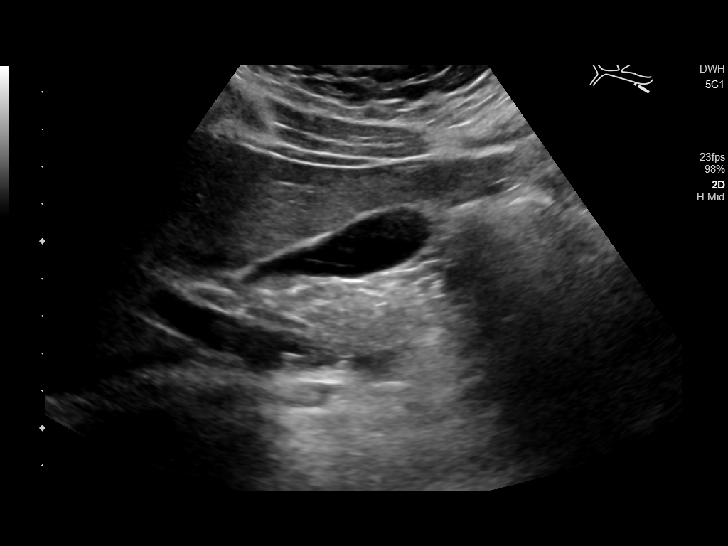
[im 24/38]
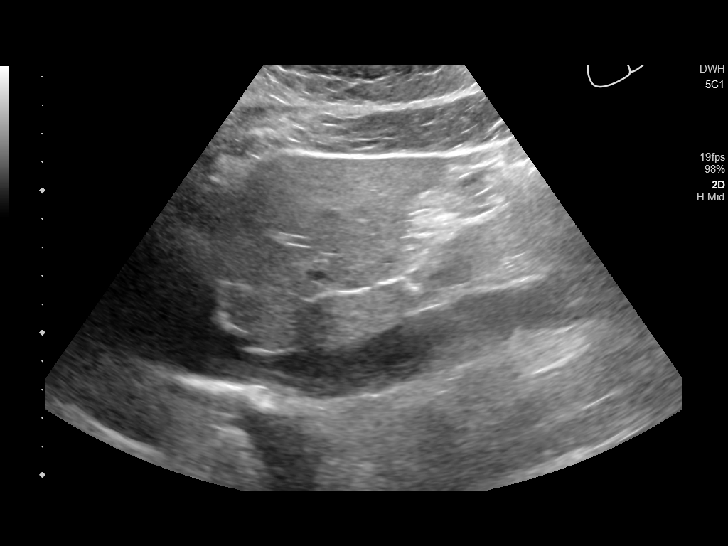
[im 25/38]
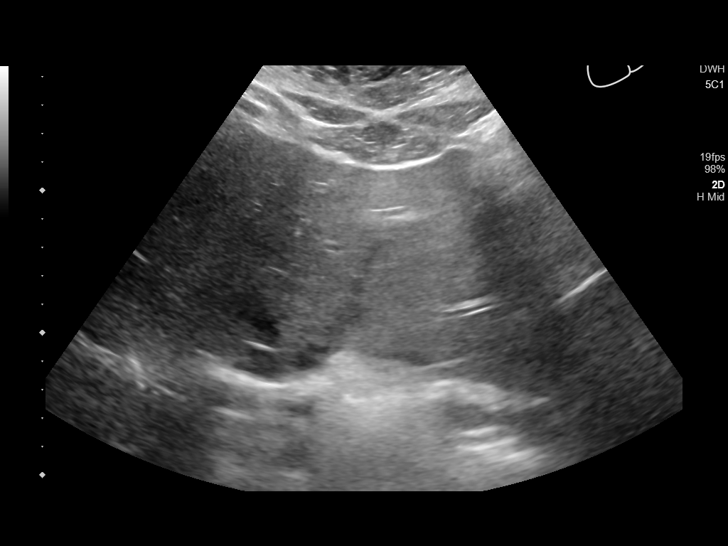
[im 28/38]
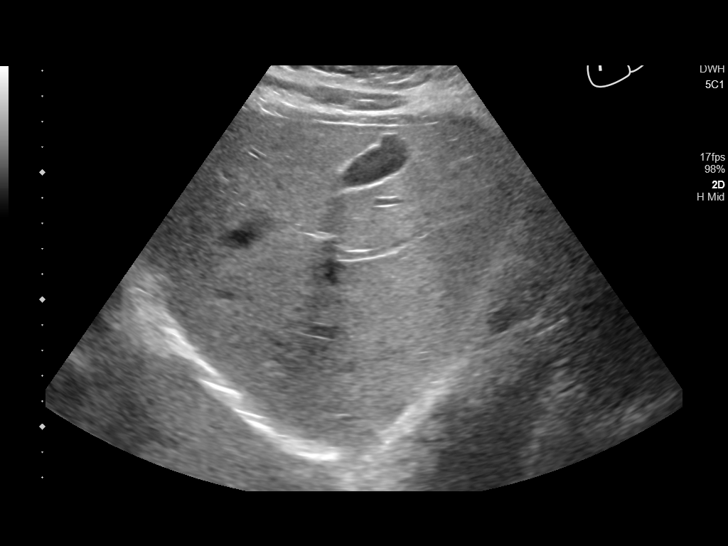
[im 31/38]
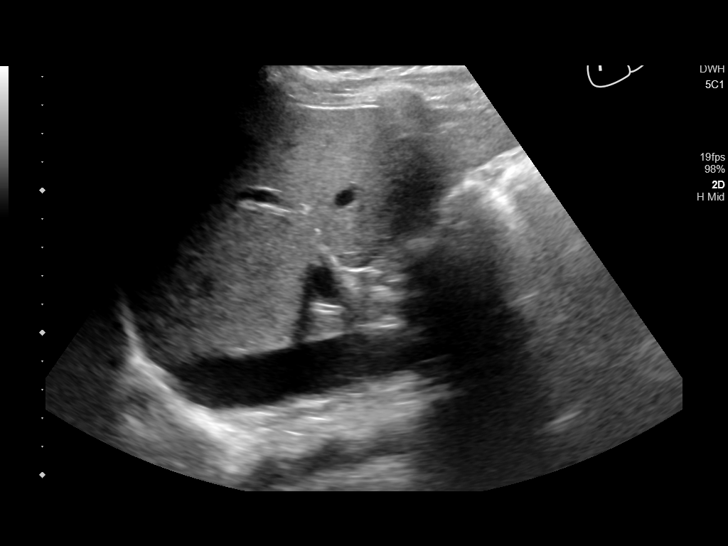
[im 34/38]
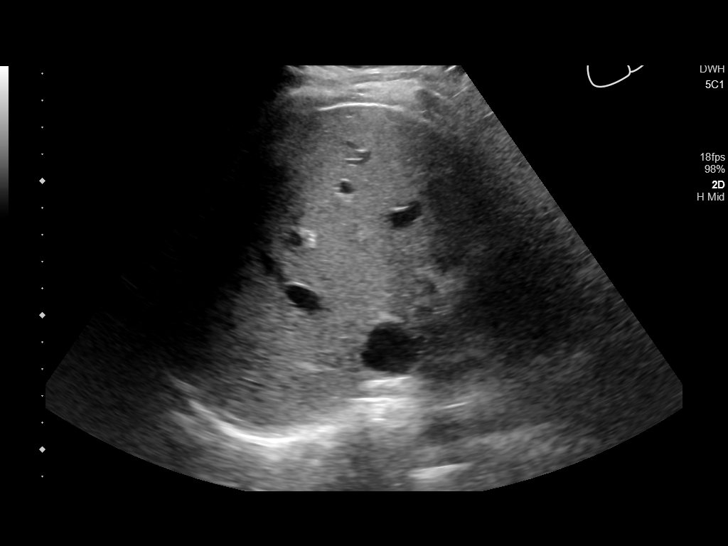
[im 38/38]
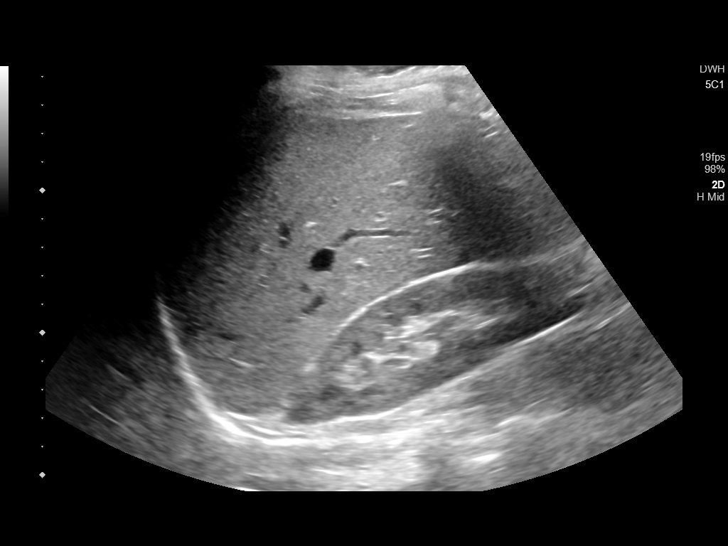

[14 of 25 positions shown; findings below may reference images not displayed]

FINDINGS: Gallbladder:

No gallstones or wall thickening visualized. No sonographic Murphy
sign noted by sonographer.

Common bile duct:

Diameter: 4 mm, within normal limits

Liver:

No focal lesion identified. Within normal limits in parenchymal
echogenicity. Portal vein is patent on color Doppler imaging with
normal direction of blood flow towards the liver.

Other: None.
IMPRESSION: Unremarkable right upper quadrant ultrasound.
